# Patient Record
Sex: Male | Born: 1985 | Race: White | Hispanic: No | Marital: Single | Smoking: Never smoker
Health system: Southern US, Community
[De-identification: ages and names within clinical notes are randomized; demographics above are authoritative.]

## PROBLEM LIST (undated history)

## (undated) DIAGNOSIS — N200 Calculus of kidney: Secondary | ICD-10-CM

## (undated) HISTORY — PX: APPENDECTOMY: SHX54

---

## 2007-01-02 ENCOUNTER — Emergency Department: Payer: Self-pay | Admitting: Emergency Medicine

## 2007-08-13 ENCOUNTER — Emergency Department: Payer: Self-pay | Admitting: Emergency Medicine

## 2007-12-17 ENCOUNTER — Emergency Department: Payer: Self-pay | Admitting: Emergency Medicine

## 2012-10-21 ENCOUNTER — Emergency Department: Payer: Self-pay | Admitting: Emergency Medicine

## 2012-10-21 LAB — CBC
HGB: 14.7 g/dL (ref 13.0–18.0)
MCHC: 34.4 g/dL (ref 32.0–36.0)
MCV: 90 fL (ref 80–100)
RBC: 4.72 10*6/uL (ref 4.40–5.90)
RDW: 12.8 % (ref 11.5–14.5)
WBC: 13 10*3/uL — ABNORMAL HIGH (ref 3.8–10.6)

## 2012-10-21 LAB — BASIC METABOLIC PANEL
Anion Gap: 6 — ABNORMAL LOW (ref 7–16)
BUN: 14 mg/dL (ref 7–18)
Chloride: 108 mmol/L — ABNORMAL HIGH (ref 98–107)
Co2: 27 mmol/L (ref 21–32)
Creatinine: 0.87 mg/dL (ref 0.60–1.30)
EGFR (Non-African Amer.): >60
Glucose: 96 mg/dL (ref 65–99)
Osmolality: 282 (ref 275–301)
Sodium: 141 mmol/L (ref 136–145)

## 2012-12-16 ENCOUNTER — Emergency Department: Payer: Self-pay | Admitting: Emergency Medicine

## 2013-01-30 ENCOUNTER — Emergency Department: Payer: Self-pay | Admitting: Emergency Medicine

## 2013-09-08 ENCOUNTER — Emergency Department: Payer: Self-pay | Admitting: Emergency Medicine

## 2013-09-08 LAB — COMPREHENSIVE METABOLIC PANEL
ALBUMIN: 4.4 g/dL (ref 3.4–5.0)
Alkaline Phosphatase: 67 U/L
Anion Gap: 4 — ABNORMAL LOW (ref 7–16)
BUN: 8 mg/dL (ref 7–18)
Bilirubin,Total: 0.2 mg/dL (ref 0.2–1.0)
CALCIUM: 9.1 mg/dL (ref 8.5–10.1)
CHLORIDE: 106 mmol/L (ref 98–107)
Co2: 29 mmol/L (ref 21–32)
Creatinine: 0.95 mg/dL (ref 0.60–1.30)
EGFR (Non-African Amer.): >60
Glucose: 90 mg/dL (ref 65–99)
Osmolality: 275 (ref 275–301)
Potassium: 4 mmol/L (ref 3.5–5.1)
SGOT(AST): 13 U/L — ABNORMAL LOW (ref 15–37)
SGPT (ALT): 25 U/L (ref 12–78)
SODIUM: 139 mmol/L (ref 136–145)
Total Protein: 8.2 g/dL (ref 6.4–8.2)

## 2013-09-08 LAB — URINALYSIS, COMPLETE
Bilirubin,UR: NEGATIVE
Glucose,UR: NEGATIVE mg/dL (ref 0–75)
KETONE: NEGATIVE
Nitrite: NEGATIVE
PH: 6 (ref 4.5–8.0)
Protein: NEGATIVE
RBC,UR: 544 /HPF (ref 0–5)
Specific Gravity: 1.012 (ref 1.003–1.030)
Squamous Epithelial: NONE SEEN

## 2013-09-08 LAB — CBC
HCT: 42.8 % (ref 40.0–52.0)
HGB: 14.5 g/dL (ref 13.0–18.0)
MCH: 30.4 pg (ref 26.0–34.0)
MCHC: 33.9 g/dL (ref 32.0–36.0)
MCV: 90 fL (ref 80–100)
Platelet: 196 10*3/uL (ref 150–440)
RBC: 4.77 10*6/uL (ref 4.40–5.90)
RDW: 12.8 % (ref 11.5–14.5)
WBC: 9.1 10*3/uL (ref 3.8–10.6)

## 2014-01-02 ENCOUNTER — Inpatient Hospital Stay: Payer: Self-pay | Admitting: Internal Medicine

## 2014-01-02 LAB — COMPREHENSIVE METABOLIC PANEL
ALT: 25 U/L
Albumin: 3.8 g/dL (ref 3.4–5.0)
Alkaline Phosphatase: 62 U/L
Anion Gap: 8 (ref 7–16)
BUN: 6 mg/dL — AB (ref 7–18)
Bilirubin,Total: 0.2 mg/dL (ref 0.2–1.0)
Calcium, Total: 8.5 mg/dL (ref 8.5–10.1)
Chloride: 108 mmol/L — ABNORMAL HIGH (ref 98–107)
Co2: 25 mmol/L (ref 21–32)
Creatinine: 0.99 mg/dL (ref 0.60–1.30)
EGFR (African American): >60
EGFR (Non-African Amer.): >60
GLUCOSE: 115 mg/dL — AB (ref 65–99)
OSMOLALITY: 280 (ref 275–301)
Potassium: 3.5 mmol/L (ref 3.5–5.1)
SGOT(AST): 18 U/L (ref 15–37)
SODIUM: 141 mmol/L (ref 136–145)
Total Protein: 7 g/dL (ref 6.4–8.2)

## 2014-01-02 LAB — URINALYSIS, COMPLETE
BILIRUBIN, UR: NEGATIVE
BLOOD: NEGATIVE
Bacteria: NONE SEEN
Glucose,UR: NEGATIVE mg/dL (ref 0–75)
KETONE: NEGATIVE
LEUKOCYTE ESTERASE: NEGATIVE
Nitrite: NEGATIVE
PH: 5 (ref 4.5–8.0)
Protein: NEGATIVE
SQUAMOUS EPITHELIAL: NONE SEEN
Specific Gravity: 1.005 (ref 1.003–1.030)
WBC UR: <1 /HPF (ref 0–5)

## 2014-01-02 LAB — CBC WITH DIFFERENTIAL/PLATELET
BASOS ABS: 0 10*3/uL (ref 0.0–0.1)
Basophil #: 0 10*3/uL (ref 0.0–0.1)
Basophil %: 0.2 %
Basophil %: 0.3 %
EOS ABS: 0.4 10*3/uL (ref 0.0–0.7)
EOS ABS: 0.3 10*3/uL (ref 0.0–0.7)
EOS PCT: 3.6 %
Eosinophil %: 3.5 %
HCT: 40.4 % (ref 40.0–52.0)
HCT: 39.2 % — AB (ref 40.0–52.0)
HGB: 13.4 g/dL (ref 13.0–18.0)
HGB: 13.1 g/dL (ref 13.0–18.0)
LYMPHS ABS: 2.2 10*3/uL (ref 1.0–3.6)
LYMPHS ABS: 2.6 10*3/uL (ref 1.0–3.6)
LYMPHS PCT: 22 %
LYMPHS PCT: 30.4 %
MCH: 30.4 pg (ref 26.0–34.0)
MCH: 30.7 pg (ref 26.0–34.0)
MCHC: 33.2 g/dL (ref 32.0–36.0)
MCHC: 33.4 g/dL (ref 32.0–36.0)
MCV: 92 fL (ref 80–100)
MCV: 92 fL (ref 80–100)
MONOS PCT: 8.7 %
Monocyte #: 0.9 x10 3/mm (ref 0.2–1.0)
Monocyte #: 0.7 x10 3/mm (ref 0.2–1.0)
Monocyte %: 8.6 %
NEUTROS ABS: 6.5 10*3/uL (ref 1.4–6.5)
NEUTROS PCT: 65.6 %
Neutrophil #: 4.9 10*3/uL (ref 1.4–6.5)
Neutrophil %: 57.1 %
Platelet: 222 10*3/uL (ref 150–440)
Platelet: 190 10*3/uL (ref 150–440)
RBC: 4.41 10*6/uL (ref 4.40–5.90)
RBC: 4.27 10*6/uL — ABNORMAL LOW (ref 4.40–5.90)
RDW: 13.1 % (ref 11.5–14.5)
RDW: 13.2 % (ref 11.5–14.5)
WBC: 10 10*3/uL (ref 3.8–10.6)
WBC: 8.6 10*3/uL (ref 3.8–10.6)

## 2014-01-02 LAB — FIBRIN DEGRADATION PROD.(ARMC ONLY): Fibrin Degradation Prod.: <10 ug/ml (ref 2.1–7.7)

## 2014-01-02 LAB — PROTIME-INR
INR: 1
INR: 1
PROTHROMBIN TIME: 13.2 s (ref 11.5–14.7)
Prothrombin Time: 13.1 secs (ref 11.5–14.7)

## 2014-01-02 LAB — D-DIMER(ARMC): D-Dimer: 265 ng/ml

## 2014-01-02 LAB — FIBRINOGEN: Fibrinogen: 317 mg/dL (ref 210–470)

## 2014-01-02 LAB — APTT
Activated PTT: 28.6 secs (ref 23.6–35.9)
Activated PTT: 29 secs (ref 23.6–35.9)

## 2014-02-27 ENCOUNTER — Emergency Department: Payer: Self-pay | Admitting: Emergency Medicine

## 2014-04-04 IMAGING — CT CT STONE STUDY
3 of 4 series · 5 of 16 positions shown, 6 images · non-contrast
Comparison: None.

CLINICAL DATA: 27-year-old male with left flank pain radiating into
the left lower quadrant with associated hematuria.

EXAM:
CT ABDOMEN AND PELVIS WITHOUT CONTRAST
TECHNIQUE: Multidetector CT imaging of the abdomen and pelvis was performed
following the standard protocol without IV contrast.

[Series 4: lung · axial · 0.74mm/px · z∈[-726,-726]mm · 1 of 22 slices shown, 2 images]
[im 1/22  soft-tissue]
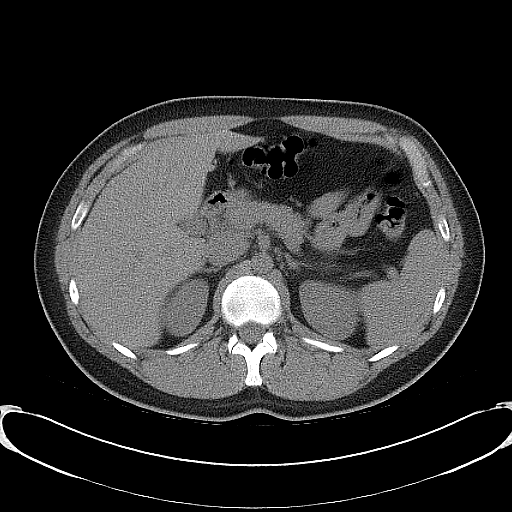
[im 1/22  bone]
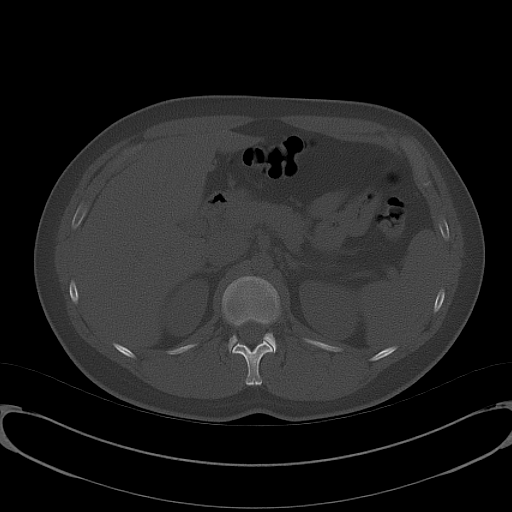

[Series 5: coronal · coronal · 0.69mm/px · 3 of 109 slices shown]
[im 28/109  soft-tissue]
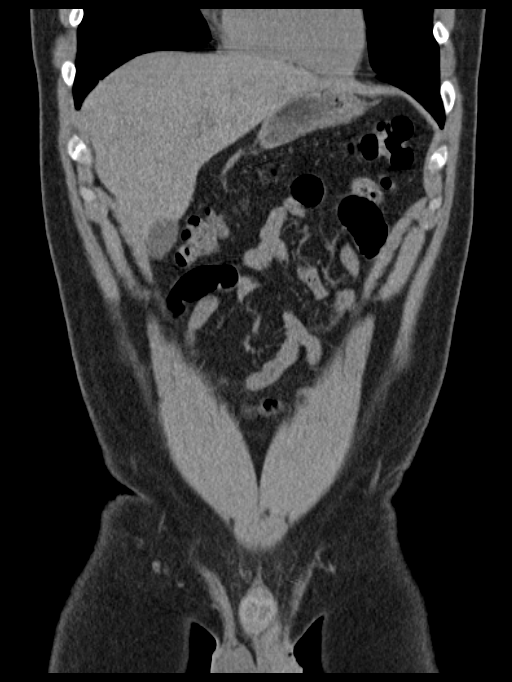
[im 55/109  soft-tissue]
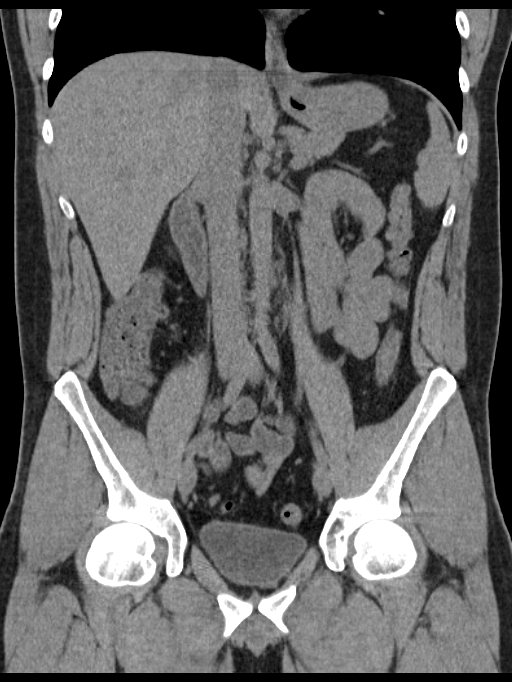
[im 82/109  soft-tissue]
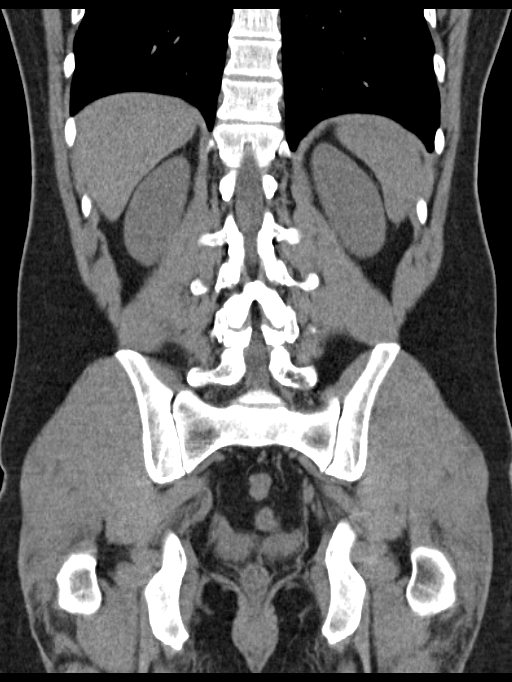

[Series 6: sagittal · sagittal · 0.53mm/px · 1 of 164 slices shown]
[im 82/164  soft-tissue]
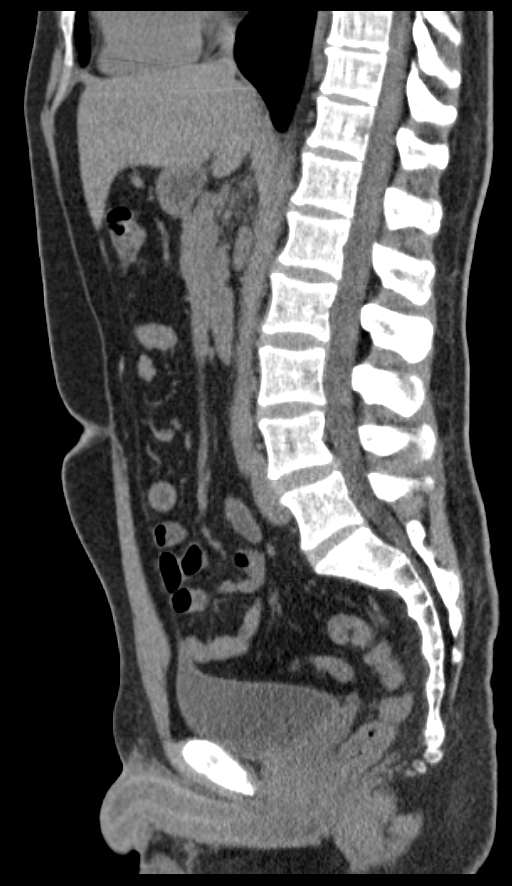

[5 of 16 positions shown; findings below may reference images not displayed]

FINDINGS: Lower Chest: The lung bases are clear. Visualized cardiac structures
are within normal limits for size. No pericardial effusion.
Unremarkable visualized distal thoracic esophagus.

Abdomen: Unenhanced CT was performed per clinician order. Lack of IV
contrast limits sensitivity and specificity, especially for
evaluation of abdominal/pelvic solid viscera. Within these
limitations, unremarkable CT appearance of the stomach, duodenum,
spleen, adrenal glands, pancreas and liver. Gallbladder is
unremarkable. No intra or extrahepatic biliary ductal dilatation.

Unremarkable appearance of the bilateral kidneys. No focal solid
lesion, hydronephrosis or nephrolithiasis.

No evidence of obstruction or focal bowel wall thickening. The
appendix is not identified and may be surgically absent or
completely decompressed. There is no evidence of inflammatory change
in the right lower quadrant. The terminal ileum is unremarkable.

Pelvis: Unremarkable bladder, prostate gland and seminal vesicles.
No free fluid or suspicious adenopathy.

Bones/Soft Tissues: Chronic bilateral L4 pars defects are present.
There is no evidence of anterolisthesis of L4 on L5. No acute
fracture or aggressive appearing lytic or blastic osseous lesion.

Vascular: Limited evaluation in the absence of IV contrast. No
significant atherosclerotic vascular disease, aneurysmal dilatation
or acute abnormality.
IMPRESSION: 1. No acute abnormality on this noncontrast CT scan of the abdomen
and pelvis. Specifically, no evidence of nephro or ureterolithiasis.
2. Chronic bilateral L4 pars defects (spondylolysis) without
evidence of anterolisthesis.

## 2014-10-04 NOTE — Discharge Summary (Signed)
PATIENT NAME:  Adrian Sanders, Adrian Sanders MR#:  [id number] DATE OF BIRTH:  Jun 18, 1985  DATE OF ADMISSION:  01/02/2014 DATE OF DISCHARGE:  01/02/2014  AGAINST MEDICAL ADVICE SUMMARY:   ADMITTING DIAGNOSIS: Snakebite with a copperhead snake, as well as possible cellulitis of the left hand.   HOSPITAL COURSE: Please refer to H and P done by me on admission. The patient is a 29 year old who was bitten on his hand by a copperhead snake.  Came in with swelling of his hands, as well as streaking down his forearm. He received antivenom in the ED and was admitted to the hospital with antibiotics and continuous antivenom. The patient who was in the hospital until later during the hospitalization where he left AMA. He was explained the risk of leaving without complete treatment, which includes possible death. Worsening infection of his hand; however,  patient did not want to stay and left AMA.   TIME SPENT: 35 minutes.     ____________________________ Lacie ScottsShreyang H. Allena KatzPatel, MD shp:TT D: 01/22/2014 10:23:28 ET T: 01/22/2014 11:45:39 ET JOB#: [id number]  cc: Erandi Lemma H. Allena KatzPatel, MD, <Dictator> Charise CarwinSHREYANG H Lilianne Delair MD ELECTRONICALLY SIGNED 01/26/2014 18:41

## 2014-10-04 NOTE — H&P (Signed)
PATIENT NAME:  Adrian Sanders, Adrian Sanders MR#:  [id number] DATE OF BIRTH:  [birth year]  DATE OF ADMISSION:  01/02/2014  PRIMARY CARE PROVIDER: None.  EMERGENCY DEPARTMENT REFERRING PHYSICIAN: Dr. Dorothea GlassmanPaul Malinda.   CHIEF COMPLAINT: Snakebite with a copperhead.   HISTORY OF PRESENT ILLNESS: The patient is a 29 year old, white male, relatively healthy, who reports that he was working on a deck when a snake came from under the deck and bit his finger on the left hand. He states that immediately he started having pain shooting down the finger, and within 15 minutes, he started having erythema on the lateral aspect of his arm and forearm. The patient came to the ED. He actually killed the snake and brought the snake to the Emergency Room. The snake was identified as a copperhead. Poison control was called regarding the snakebite and they recommended a pathway with antivenom with Crotalidae. The patient is in the process of receiving 4 vials, and then they recommended every 6 hours, 2 vials for 18 hours. The patient, besides having significant pain, is denying any fevers or chills. Denies any chest pain, shortness of breath. No nausea, vomiting or diarrhea.   PAST MEDICAL HISTORY: None.   PAST SURGICAL HISTORY: None.   ALLERGIES: PENICILLIN.   MEDICATIONS: None.   SOCIAL HISTORY: Does not smoke. Does not drink. Uses marijuana from time to time.   FAMILY HISTORY: Prostate cancer in his father.   REVIEW OF SYSTEMS:  CONSTITUTIONAL: Denies any fevers, fatigue, or weakness. Significant pain in the left arm. No weight loss. No weight gain.  EYES: No blurred or double vision. No pain. No redness. No inflammation. No glaucoma. No cataracts.  ENT: No tinnitus. No ear pain. No hearing loss. No seasonal or year-round allergies. No epistaxis. No discharge. No difficulty swallowing.  RESPIRATORY: Denies any cough, wheezing, hemoptysis. No COPD.  CARDIOVASCULAR: Denies any chest pain, orthopnea, edema, or arrhythmia.   GASTROINTESTINAL: No nausea, vomiting, diarrhea. No abdominal pain. No hematemesis. No melena. No guarding. No IBS. No jaundice.  GENITOURINARY: Denies any dysuria, hematuria, renal calculus or frequency.  ENDOCRINE: Denies any polyuria, nocturia or thyroid problems. HEMATOLOGIC AND LYMPHATIC: Denies anemia, easy bruisability or bleeding.  SKIN: No acne. Complains of erythema involving the medial aspect of his left arm.  MUSCULOSKELETAL: Denies any pain in the neck, back or shoulder.  NEUROLOGIC: No numbness, CVA, or transient ischemic attack.  PSYCHIATRIC: No anxiety, insomnia or ADD.   PHYSICAL EXAMINATION:  VITAL SIGNS: Temperature 97.8, pulse 85, respirations 20, blood pressure 142/75, O2 of 98%.  GENERAL: The patient is a well-developed, well-nourished male in no acute distress.  HEENT: Head atraumatic, normocephalic. Pupils equally round, reactive to light and accommodation. There is no conjunctival pallor. No scleral icterus. Nasal examination shows no  or ulceration. Oropharynx is clear without any exudate.  NECK: Supple without any JVD.  CARDIOVASCULAR: Regular rate and rhythm. No murmurs, rubs, clicks, or gallops.  LUNGS: Clear to auscultation bilaterally without any rales, rhonchi or wheezing.  ABDOMEN: Soft, nontender, nondistended. Positive bowel sounds x 4.  EXTREMITIES: No clubbing, cyanosis, or edema.  SKIN: He has a snakebite entry on his second ring finger on the left hand. There is some mild swelling in that area. He has erythema on the medial aspect of his left arm and forearm with erythema and some swelling noted.  NEUROLOGIC: Awake, alert, oriented x 3. No focal deficits.  PSYCHIATRIC: Not anxious or depressed.  LYMPH NODES: Nonpalpable. VASCULAR: Good DP and PT pulses.  LABORATORY DATA:  PTT is 28.6. WBC 10, hemoglobin 13.4, platelet count 222,000. Glucose 115, BUN 6, creatinine 0.99, sodium 141, potassium 3.5, chloride 108. LFTs are normal. D-dimer 2.65. INR 1.0,  fibrinogen 317.   ASSESSMENT AND PLAN: The patient is a 29 year old, bitten by a rattlesnake on the finger, now has erythema traveling up to his arm.   1. Copperhead snakebite. The patient will be  admited to hospital  due to the severity of his snakebite. Poison control has been contacted. They recommended antivenom every 6 hours for 3 more doses. We will monitor his coagulation, CBC, and CMP. 2. Erythema of the left hand. We will also treat her with IV antibiotics with cefazolin. Monitor the erythema.  3. Miscellaneous. The patient is ambulatory. He will also have lower extremity compression devices for deep vein thrombosis prophylaxis.   TIME SPENT: 50 minutes.     ____________________________ Lacie Scotts Allena Katz, MD shp:jr D: 01/02/2014 15:34:38 ET T: 01/02/2014 16:39:28 ET JOB#: [id number]  cc: Yekaterina Escutia H. Allena Katz, MD, <Dictator> Charise Carwin MD ELECTRONICALLY SIGNED 01/11/2014 11:05

## 2014-11-01 ENCOUNTER — Emergency Department
Admission: EM | Admit: 2014-11-01 | Discharge: 2014-11-01 | Disposition: A | Discharge status: Home / Self Care | Payer: Medicaid Other | Attending: Emergency Medicine | Admitting: Emergency Medicine

## 2014-11-01 ENCOUNTER — Encounter: Payer: Self-pay | Admitting: *Deleted

## 2014-11-01 ENCOUNTER — Emergency Department: Payer: Medicaid Other

## 2014-11-01 DIAGNOSIS — N200 Calculus of kidney: Secondary | ICD-10-CM | POA: Diagnosis not present

## 2014-11-01 DIAGNOSIS — Z880 Allergy status to penicillin: Secondary | ICD-10-CM | POA: Diagnosis not present

## 2014-11-01 DIAGNOSIS — R300 Dysuria: Secondary | ICD-10-CM | POA: Diagnosis present

## 2014-11-01 HISTORY — DX: Calculus of kidney: N20.0

## 2014-11-01 LAB — URINALYSIS COMPLETE WITH MICROSCOPIC (ARMC ONLY)
BACTERIA UA: NONE SEEN
Bilirubin Urine: NEGATIVE
GLUCOSE, UA: NEGATIVE mg/dL
KETONES UR: NEGATIVE mg/dL
Leukocytes, UA: NEGATIVE
Nitrite: NEGATIVE
PH: 6 (ref 5.0–8.0)
PROTEIN: NEGATIVE mg/dL
SPECIFIC GRAVITY, URINE: 1.014 (ref 1.005–1.030)
SQUAMOUS EPITHELIAL / LPF: NONE SEEN

## 2014-11-01 MED ORDER — TAMSULOSIN HCL 0.4 MG PO CAPS
ORAL_CAPSULE | ORAL | Status: AC
  Filled 2014-11-01: qty 1

## 2014-11-01 MED ORDER — OXYCODONE-ACETAMINOPHEN 5-325 MG PO TABS
ORAL_TABLET | ORAL | Status: AC
  Filled 2014-11-01: qty 2

## 2014-11-01 MED ORDER — OXYCODONE-ACETAMINOPHEN 5-325 MG PO TABS: 1.0000 | ORAL_TABLET | Freq: Four times a day (QID) | ORAL | Status: AC | PRN

## 2014-11-01 MED ORDER — TAMSULOSIN HCL 0.4 MG PO CAPS: 0.4000 mg | ORAL_CAPSULE | Freq: Every morning | ORAL | Status: AC

## 2014-11-01 MED ORDER — TAMSULOSIN HCL 0.4 MG PO CAPS
0.4000 mg | ORAL_CAPSULE | Freq: Once | ORAL | Status: AC
  Administered 2014-11-01: 0.4 mg via ORAL

## 2014-11-01 MED ORDER — OXYCODONE-ACETAMINOPHEN 5-325 MG PO TABS
2.0000 | ORAL_TABLET | Freq: Once | ORAL | Status: AC
  Administered 2014-11-01: 2 via ORAL

## 2014-11-01 NOTE — Discharge Instructions (Signed)
Kidney Stones Kidney stones (urolithiasis) are solid masses that form inside your kidneys. The intense pain is caused by the stone moving through the kidney, ureter, bladder, and urethra (urinary tract). When the stone moves, the ureter starts to spasm around the stone. The stone is usually passed in your pee (urine).  HOME CARE  Drink enough fluids to keep your pee clear or pale yellow. This helps to get the stone out.  Strain all pee through the provided strainer. Do not pee without peeing through the strainer, not even once. If you pee the stone out, catch it in the strainer. The stone may be as small as a grain of salt. Take this to your doctor. This will help your doctor figure out what you can do to try to prevent more kidney stones.  Only take medicine as told by your doctor.  Follow up with your doctor as told.  Get follow-up X-rays as told by your doctor. GET HELP IF: You have pain that gets worse even if you have been taking pain medicine. GET HELP RIGHT AWAY IF:   Your pain does not get better with medicine.  You have a fever or shaking chills.  Your pain increases and gets worse over 18 hours.  You have new belly (abdominal) pain.  You feel faint or pass out.  You are unable to pee. MAKE SURE YOU:   Understand these instructions.  Will watch your condition.  Will get help right away if you are not doing well or get worse. Document Released: 11/16/2007 Document Revised: 01/30/2013 Document Reviewed: 10/31/2012 [facility] Patient Information 2015 [place], [place]. This information is not intended to replace advice given to you by your health care provider. Make sure you discuss any questions you have with your health care provider.  Dysuria Dysuria is the medical term for pain with urination. There are many causes for dysuria, but urinary tract infection is the most common. If a urinalysis was performed it can show that there is a urinary tract infection. A urine culture  confirms that you or your child is sick. You will need to follow up with a healthcare provider because:  If a urine culture was done you will need to know the culture results and treatment recommendations.  If the urine culture was positive, you or your child will need to be put on antibiotics or know if the antibiotics prescribed are the right antibiotics for your urinary tract infection.  If the urine culture is negative (no urinary tract infection), then other causes may need to be explored or antibiotics need to be stopped. Today laboratory work may have been done and there does not seem to be an infection. If cultures were done they will take at least 24 to 48 hours to be completed. Today x-rays may have been taken and they read as normal. No cause can be found for the problems. The x-rays may be re-read by a radiologist and you will be contacted if additional findings are made. You or your child may have been put on medications to help with this problem until you can see your primary caregiver. If the problems get better, see your primary caregiver if the problems return. If you were given antibiotics (medications which kill germs), take all of the mediations as directed for the full course of treatment.  If laboratory work was done, you need to find the results. Leave a telephone number where you can be reached. If this is not possible, make sure you  find out how you are to get test results. HOME CARE INSTRUCTIONS   Drink lots of fluids. For adults, drink eight, 8 ounce glasses of clear juice or water a day. For children, replace fluids as suggested by your caregiver.  Empty the bladder often. Avoid holding urine for long periods of time.  After a bowel movement, women should cleanse front to back, using each tissue only once.  Empty your bladder before and after sexual intercourse.  Take all the medicine given to you until it is gone. You may feel better in a few days, but TAKE ALL  MEDICINE.  Avoid caffeine, tea, alcohol and carbonated beverages, because they tend to irritate the bladder.  In men, alcohol may irritate the prostate.  Only take over-the-counter or prescription medicines for pain, discomfort, or fever as directed by your caregiver.  If your caregiver has given you a follow-up appointment, it is very important to keep that appointment. Not keeping the appointment could result in a chronic or permanent injury, pain, and disability. If there is any problem keeping the appointment, you must call back to this facility for assistance. SEEK IMMEDIATE MEDICAL CARE IF:   Back pain develops.  A fever develops.  There is nausea (feeling sick to your stomach) or vomiting (throwing up).  Problems are no better with medications or are getting worse. MAKE SURE YOU:   Understand these instructions.  Will watch your condition.  Will get help right away if you are not doing well or get worse. Document Released: 02/26/2004 Document Revised: 08/22/2011 Document Reviewed: 01/03/2008 [facility] Patient Information 2015 [place], [place]. This information is not intended to replace advice given to you by your health care provider. Make sure you discuss any questions you have with your health care provider.

## 2014-11-01 NOTE — ED Notes (Signed)
Pt presents w/ c/o hematuria and dysuria. Pt states he passed 2 kidney stones this morning @ 0100. Pt c/o continue pain in urethra and difficulty urinating.

## 2014-11-01 NOTE — ED Provider Notes (Signed)
[facility] Emergency Department Provider Note     Time seen: ----------------------------------------- 6:06 AM on 11/01/2014 -----------------------------------------    I have reviewed the triage vital signs and the nursing notes.   HISTORY  Chief Complaint Dysuria    HPI Adrian Sanders is a 29 y.o. male who presents ER with hematuria and dysuria. He had severe right-sided sharp back pain that woke him up around 100. He had trouble urinating that he was able to pass 2 kidney stones. He could hear them hitting the toilet. He continues to have pain in his urethra like he speaking bleach he states. Denies history of same    Past Medical History  Diagnosis Date  . Kidney stone     There are no active problems to display for this patient.   Past Surgical History  Procedure Laterality Date  . Appendectomy      No current outpatient prescriptions on file.  Allergies Penicillins  History reviewed. No pertinent family history.  Social History History  Substance Use Topics  . Smoking status: Never Smoker   . Smokeless tobacco: Never Used  . Alcohol Use: No    Review of Systems Constitutional: Negative for fever. Eyes: Negative for visual changes. ENT: Negative for sore throat. Cardiovascular: Negative for chest pain. Respiratory: Negative for shortness of breath. Gastrointestinal: Negative for abdominal pain, vomiting and diarrhea. Genitourinary: Positive for dysuria and hematuria Musculoskeletal: Right-sided low back pain Skin: Negative for rash. Neurological: Negative for headaches, focal weakness or numbness.  10-point ROS otherwise negative.  ____________________________________________   PHYSICAL EXAM:  VITAL SIGNS: ED Triage Vitals  Enc Vitals Group     BP 11/01/14 0355 122/71 mmHg     Pulse Rate 11/01/14 0355 80     Resp --      Temp 11/01/14 0355 98.2 F (36.8 C)     Temp Source 11/01/14 0355 Oral     SpO2  11/01/14 0355 100 %     Weight 11/01/14 0355 160 lb (72.576 kg)     Height 11/01/14 0355  (1.676 m)     Head Cir --      Peak Flow --      Pain Score 11/01/14 0411 8     Pain Loc --      Pain Edu? --      Excl. in GC? --     Constitutional: Alert and oriented. Well appearing and in no distress. Eyes: Conjunctivae are normal. PERRL. Normal extraocular movements. ENT   Head: Normocephalic and atraumatic.   Nose: No congestion/rhinnorhea.   Mouth/Throat: Mucous membranes are moist.   Neck: No stridor. Hematological/Lymphatic/Immunilogical: No cervical lymphadenopathy. Cardiovascular: Normal rate, regular rhythm. Normal and symmetric distal pulses are present in all extremities. No murmurs, rubs, or gallops. Respiratory: Normal respiratory effort without tachypnea nor retractions. Breath sounds are clear and equal bilaterally. No wheezes/rales/rhonchi. Gastrointestinal: Soft and nontender. No distention. No abdominal bruits. There is no CVA tenderness. Genitourinary: Penis and scrotum appear normal Musculoskeletal: Nontender with normal range of motion in all extremities. No joint effusions.  No lower extremity tenderness nor edema. Neurologic:  Normal speech and language. No gross focal neurologic deficits are appreciated. Speech is normal. No gait instability. Skin:  Skin is warm, dry and intact. No rash noted. Psychiatric: Mood and affect are normal. Speech and behavior are normal. Patient exhibits appropriate insight and judgment.  ____________________________________________    LABS (pertinent positives/negatives)  Labs Reviewed  URINALYSIS COMPLETEWITH MICROSCOPIC ([facility])  - Abnormal; Notable for  the following:    Color, Urine STRAW (*)    APPearance CLEAR (*)    Hgb urine dipstick 3+ (*)    All other components within normal limits    ____________________________________________  ED COURSE:  Pertinent labs & imaging results that were available during  my care of the patient were reviewed by me and considered in my medical decision making (see chart for details).  Pain control and Flomax given, will get KUB ____________________________________________   RADIOLOGY  KUB FINDINGS: The bowel gas pattern is normal. No radio-opaque calculi or other significant radiographic abnormality are seen.  IMPRESSION: Negative. ____________________________________________    FINAL ASSESSMENT AND PLAN  Renal colic and hematuria  Plan: Patient was dysuria relative to recently passed kidney stone. No kidney stones identified on his KUB. Given a Percocet as well as some Flomax, he'll continue on same and follow-up with his doctor or return here for improving.    Emily FilbertWilliams, Jonathan E, MD   Emily FilbertJonathan E Williams, MD 11/01/14 [id number]

## 2015-02-19 ENCOUNTER — Encounter: Payer: Self-pay | Admitting: Emergency Medicine

## 2015-02-19 DIAGNOSIS — S79912A Unspecified injury of left hip, initial encounter: Secondary | ICD-10-CM | POA: Diagnosis not present

## 2015-02-19 DIAGNOSIS — W1839XA Other fall on same level, initial encounter: Secondary | ICD-10-CM | POA: Diagnosis not present

## 2015-02-19 DIAGNOSIS — Y998 Other external cause status: Secondary | ICD-10-CM | POA: Diagnosis not present

## 2015-02-19 DIAGNOSIS — Z880 Allergy status to penicillin: Secondary | ICD-10-CM | POA: Diagnosis not present

## 2015-02-19 DIAGNOSIS — Y9289 Other specified places as the place of occurrence of the external cause: Secondary | ICD-10-CM | POA: Diagnosis not present

## 2015-02-19 DIAGNOSIS — S99912A Unspecified injury of left ankle, initial encounter: Secondary | ICD-10-CM | POA: Diagnosis not present

## 2015-02-19 DIAGNOSIS — S8992XA Unspecified injury of left lower leg, initial encounter: Secondary | ICD-10-CM | POA: Insufficient documentation

## 2015-02-19 DIAGNOSIS — Y9389 Activity, other specified: Secondary | ICD-10-CM | POA: Insufficient documentation

## 2015-02-19 NOTE — ED Notes (Signed)
Pt presents to ED with left knee pain. Pt states earlier this evening he was working on a 2 story roof and "walked off" of it. Pt states he landed on his feet first and then fell to his back. States he has pain to his left hip, left knee, and left ankle but reports his knee has gotten progressively worse and would like to have it checked. Denies hitting his head or any other injury.

## 2015-02-20 ENCOUNTER — Emergency Department: Payer: Medicaid Other

## 2015-02-20 ENCOUNTER — Emergency Department
Admission: EM | Admit: 2015-02-20 | Discharge: 2015-02-20 | Disposition: A | Discharge status: Home / Self Care | Payer: Medicaid Other | Attending: Emergency Medicine | Admitting: Emergency Medicine

## 2015-02-20 DIAGNOSIS — M25562 Pain in left knee: Secondary | ICD-10-CM

## 2015-02-20 MED ORDER — IBUPROFEN 800 MG PO TABS
800.0000 mg | ORAL_TABLET | Freq: Once | ORAL | Status: AC
  Administered 2015-02-20: 800 mg via ORAL
  Filled 2015-02-20: qty 1

## 2015-02-20 NOTE — Discharge Instructions (Signed)
Please seek medical attention for any high fevers, chest pain, shortness of breath, change in behavior, persistent vomiting, bloody stool or any other new or concerning symptoms. ° °Knee Pain °The knee is the complex joint between your thigh and your lower leg. It is made up of bones, tendons, ligaments, and cartilage. The bones that make up the knee are: °· The femur in the thigh. °· The tibia and fibula in the lower leg. °· The patella or kneecap riding in the groove on the lower femur. °CAUSES  °Knee pain is a common complaint with many causes. A few of these causes are: °· Injury, such as: °¨ A ruptured ligament or tendon injury. °¨ Torn cartilage. °· Medical conditions, such as: °¨ Gout °¨ Arthritis °¨ Infections °· Overuse, over training, or overdoing a physical activity. °Knee pain can be minor or severe. Knee pain can accompany debilitating injury. Minor knee problems often respond well to self-care measures or get well on their own. More serious injuries may need medical intervention or even surgery. °SYMPTOMS °The knee is complex. Symptoms of knee problems can vary widely. Some of the problems are: °· Pain with movement and weight bearing. °· Swelling and tenderness. °· Buckling of the knee. °· Inability to straighten or extend your knee. °· Your knee locks and you cannot straighten it. °· Warmth and redness with pain and fever. °· Deformity or dislocation of the kneecap. °DIAGNOSIS  °Determining what is wrong may be very straight forward such as when there is an injury. It can also be challenging because of the complexity of the knee. Tests to make a diagnosis may include: °· Your caregiver taking a history and doing a physical exam. °· Routine X-rays can be used to rule out other problems. X-rays will not reveal a cartilage tear. Some injuries of the knee can be diagnosed by: °¨ Arthroscopy a surgical technique by which a small video camera is inserted through tiny incisions on the sides of the knee.  This procedure is used to examine and repair internal knee joint problems. Tiny instruments can be used during arthroscopy to repair the torn knee cartilage (meniscus). °¨ Arthrography is a radiology technique. A contrast liquid is directly injected into the knee joint. Internal structures of the knee joint then become visible on X-ray film. °¨ An MRI scan is a non X-ray radiology procedure in which magnetic fields and a computer produce two- or three-dimensional images of the inside of the knee. Cartilage tears are often visible using an MRI scanner. MRI scans have largely replaced arthrography in diagnosing cartilage tears of the knee. °· Blood work. °· Examination of the fluid that helps to lubricate the knee joint (synovial fluid). This is done by taking a sample out using a needle and a syringe. °TREATMENT °The treatment of knee problems depends on the cause. Some of these treatments are: °· Depending on the injury, proper casting, splinting, surgery, or physical therapy care will be needed. °· Give yourself adequate recovery time. Do not overuse your joints. If you begin to get sore during workout routines, back off. Slow down or do fewer repetitions. °· For repetitive activities such as cycling or running, maintain your strength and nutrition. °· Alternate muscle groups. For example, if you are a weight lifter, work the upper body on one day and the lower body the next. °· Either tight or weak muscles do not give the proper support for your knee. Tight or weak muscles do not absorb the stress placed on   the knee joint. Keep the muscles surrounding the knee strong. °· Take care of mechanical problems. °¨ If you have flat feet, orthotics or special shoes may help. See your caregiver if you need help. °¨ Arch supports, sometimes with wedges on the inner or outer aspect of the heel, can help. These can shift pressure away from the side of the knee most bothered by osteoarthritis. °¨ A brace called an "unloader"  brace also may be used to help ease the pressure on the most arthritic side of the knee. °· If your caregiver has prescribed crutches, braces, wraps or ice, use as directed. The acronym for this is PRICE. This means protection, rest, ice, compression, and elevation. °· Nonsteroidal anti-inflammatory drugs (NSAIDs), can help relieve pain. But if taken immediately after an injury, they may actually increase swelling. Take NSAIDs with food in your stomach. Stop them if you develop stomach problems. Do not take these if you have a history of ulcers, stomach pain, or bleeding from the bowel. Do not take without your caregiver's approval if you have problems with fluid retention, heart failure, or kidney problems. °· For ongoing knee problems, physical therapy may be helpful. °· Glucosamine and chondroitin are over-the-counter dietary supplements. Both may help relieve the pain of osteoarthritis in the knee. These medicines are different from the usual anti-inflammatory drugs. Glucosamine may decrease the rate of cartilage destruction. °· Injections of a corticosteroid drug into your knee joint may help reduce the symptoms of an arthritis flare-up. They may provide pain relief that lasts a few months. You may have to wait a few months between injections. The injections do have a small increased risk of infection, water retention, and elevated blood sugar levels. °· Hyaluronic acid injected into damaged joints may ease pain and provide lubrication. These injections may work by reducing inflammation. A series of shots may give relief for as long as 6 months. °· Topical painkillers. Applying certain ointments to your skin may help relieve the pain and stiffness of osteoarthritis. Ask your pharmacist for suggestions. Many over the-counter products are approved for temporary relief of arthritis pain. °· In some countries, doctors often prescribe topical NSAIDs for relief of chronic conditions such as arthritis and tendinitis.  A review of treatment with NSAID creams found that they worked as well as oral medications but without the serious side effects. °PREVENTION °· Maintain a healthy weight. Extra pounds put more strain on your joints. °· Get strong, stay limber. Weak muscles are a common cause of knee injuries. Stretching is important. Include flexibility exercises in your workouts. °· Be smart about exercise. If you have osteoarthritis, chronic knee pain or recurring injuries, you may need to change the way you exercise. This does not mean you have to stop being active. If your knees ache after jogging or playing basketball, consider switching to swimming, water aerobics, or other low-impact activities, at least for a few days a week. Sometimes limiting high-impact activities will provide relief. °· Make sure your shoes fit well. Choose footwear that is right for your sport. °· Protect your knees. Use the proper gear for knee-sensitive activities. Use kneepads when playing volleyball or laying carpet. Buckle your seat belt every time you drive. Most shattered kneecaps occur in car accidents. °· Rest when you are tired. °SEEK MEDICAL CARE IF:  °You have knee pain that is continual and does not seem to be getting better.  °SEEK IMMEDIATE MEDICAL CARE IF:  °Your knee joint feels hot to the touch and   you have a high fever. °MAKE SURE YOU:  °· Understand these instructions. °· Will watch your condition. °· Will get help right away if you are not doing well or get worse. °Document Released: 03/27/2007 Document Revised: 08/22/2011 Document Reviewed: 03/27/2007 °[employer]® Patient Information ©2015 [employer]. This information is not intended to replace advice given to you by your health care provider. Make sure you discuss any questions you have with your health care provider. ° °

## 2015-02-20 NOTE — ED Provider Notes (Signed)
[facility] Emergency Department Provider Note    ____________________________________________  Time seen: 0205  I have reviewed the triage vital signs and the nursing notes.   HISTORY  Chief Complaint Knee Pain and Fall   History limited by: Not Limited   HPI Adrian Sanders is a 29 y.o. male who presents to the emergency department today with primary complaint of left knee pain. The patient states that he accident walked off of a 17 foot roof. He states he fell onto his legs and then fell backwards. He denies hearing any crack in her popping noises. He denies blacking out or significant trauma to his head. States that he was able to walk with a limp afterwards. He states he is having some pain in his left ankle and left hip his primary pain is in his left knee. It is worse with flexion.     Past Medical History  Diagnosis Date  . Kidney stone     There are no active problems to display for this patient.   Past Surgical History  Procedure Laterality Date  . Appendectomy      Current Outpatient Rx  Name  Route  Sig  Dispense  Refill  . oxyCODONE-acetaminophen (ROXICET) 5-325 MG per tablet   Oral   Take 1 tablet by mouth every 6 (six) hours as needed.   20 tablet   0     Allergies Penicillins  History reviewed. No pertinent family history.  Social History Social History  Substance Use Topics  . Smoking status: Never Smoker   . Smokeless tobacco: Never Used  . Alcohol Use: Yes    Review of Systems  Constitutional: Negative for fever. Cardiovascular: Negative for chest pain. Respiratory: Negative for shortness of breath. Gastrointestinal: Negative for abdominal pain, vomiting and diarrhea. Musculoskeletal: Left hip, left knee and left ankle pain Skin: Negative for rash. Neurological: Negative for headaches, focal weakness or numbness.   10-point ROS otherwise  negative.  ____________________________________________   PHYSICAL EXAM:  VITAL SIGNS: ED Triage Vitals  Enc Vitals Group     BP 02/19/15 2128 126/78 mmHg     Pulse Rate 02/19/15 2128 88     Resp 02/19/15 2128 18     Temp 02/19/15 2128 98.7 F (37.1 C)     Temp Source 02/19/15 2128 Oral     SpO2 02/19/15 2128 97 %     Weight 02/19/15 2128 165 lb (74.844 kg)     Height 02/19/15 2128 5\' 6"  (1.676 m)     Head Cir --      Peak Flow --      Pain Score 02/19/15 2129 4   Constitutional: Alert and oriented. Well appearing and in no distress. Eyes: Conjunctivae are normal. PERRL. Normal extraocular movements. ENT   Head: Normocephalic and atraumatic.   Nose: No congestion/rhinnorhea.   Mouth/Throat: Mucous membranes are moist.   Neck: No stridor. Cardiovascular: Normal rate, regular rhythm.   Respiratory: Normal respiratory effort without tachypnea nor retractions.  Musculoskeletal: tender to palpation on the lateral part of the left knee. No obvious deformities to the knee.  Neurologic:  Normal speech and language. No gross focal neurologic deficits are appreciated. Speech is normal.  Skin:  Skin is warm, dry and intact. No rash noted. Psychiatric: Mood and affect are normal. Speech and behavior are normal. Patient exhibits appropriate insight and judgment.  ____________________________________________    LABS (pertinent positives/negatives)  None  ____________________________________________   EKG  None  ____________________________________________  RADIOLOGY  Left knee pain IMPRESSION: Normal appendix. No evidence of bowel obstruction or inflammation. No acute process identified.  ____________________________________________   PROCEDURES  Procedure(s) performed: None  Critical Care performed: No  ____________________________________________   INITIAL IMPRESSION / ASSESSMENT AND PLAN / ED COURSE  Pertinent labs & imaging results that  were available during my care of the patient were reviewed by me and considered in my medical decision making (see chart for details).  Patient presented to the emergency department today complaining of left knee pain after a fall. X-rays without any acute osseous injury. I did walk the patient is able to walk and bear weight on his left leg and only had a mild limp. Will plan on wrapping the. I did discuss rice with the patient.  ____________________________________________   FINAL CLINICAL IMPRESSION(S) / ED DIAGNOSES  Final diagnoses:  Knee pain, acute, left     Phineas Semen, MD 02/20/15 1610

## 2015-03-31 ENCOUNTER — Emergency Department: Payer: Medicaid Other

## 2015-03-31 ENCOUNTER — Other Ambulatory Visit: Payer: Self-pay

## 2015-03-31 ENCOUNTER — Encounter: Payer: Self-pay | Admitting: *Deleted

## 2015-03-31 ENCOUNTER — Emergency Department
Admission: EM | Admit: 2015-03-31 | Discharge: 2015-03-31 | Disposition: A | Discharge status: Home / Self Care | Payer: Medicaid Other | Attending: Emergency Medicine | Admitting: Emergency Medicine

## 2015-03-31 DIAGNOSIS — R0789 Other chest pain: Secondary | ICD-10-CM

## 2015-03-31 DIAGNOSIS — Z880 Allergy status to penicillin: Secondary | ICD-10-CM | POA: Diagnosis not present

## 2015-03-31 DIAGNOSIS — F1110 Opioid abuse, uncomplicated: Secondary | ICD-10-CM | POA: Diagnosis not present

## 2015-03-31 DIAGNOSIS — F1210 Cannabis abuse, uncomplicated: Secondary | ICD-10-CM | POA: Diagnosis not present

## 2015-03-31 DIAGNOSIS — Z79899 Other long term (current) drug therapy: Secondary | ICD-10-CM | POA: Diagnosis not present

## 2015-03-31 DIAGNOSIS — F1310 Sedative, hypnotic or anxiolytic abuse, uncomplicated: Secondary | ICD-10-CM | POA: Diagnosis not present

## 2015-03-31 DIAGNOSIS — R569 Unspecified convulsions: Secondary | ICD-10-CM | POA: Diagnosis not present

## 2015-03-31 DIAGNOSIS — R079 Chest pain, unspecified: Secondary | ICD-10-CM | POA: Diagnosis present

## 2015-03-31 LAB — CBC WITH DIFFERENTIAL/PLATELET
Basophils Absolute: 0.1 10*3/uL (ref 0–0.1)
Basophils Relative: 0 %
EOS ABS: 0 10*3/uL (ref 0–0.7)
Eosinophils Relative: 0 %
HCT: 47.2 % (ref 40.0–52.0)
HEMOGLOBIN: 16.2 g/dL (ref 13.0–18.0)
LYMPHS PCT: 8 %
Lymphs Abs: 1.8 10*3/uL (ref 1.0–3.6)
MCH: 29.7 pg (ref 26.0–34.0)
MCHC: 34.2 g/dL (ref 32.0–36.0)
MCV: 86.9 fL (ref 80.0–100.0)
MONOS PCT: 7 %
Monocytes Absolute: 1.5 10*3/uL — ABNORMAL HIGH (ref 0.2–1.0)
Neutro Abs: 20.1 10*3/uL — ABNORMAL HIGH (ref 1.4–6.5)
Neutrophils Relative %: 85 %
Platelets: 267 10*3/uL (ref 150–440)
RBC: 5.44 MIL/uL (ref 4.40–5.90)
RDW: 13 % (ref 11.5–14.5)
WBC: 23.5 10*3/uL — ABNORMAL HIGH (ref 3.8–10.6)

## 2015-03-31 LAB — COMPREHENSIVE METABOLIC PANEL
ALBUMIN: 4.8 g/dL (ref 3.5–5.0)
ALK PHOS: 63 U/L (ref 38–126)
ALT: 45 U/L (ref 17–63)
AST: 26 U/L (ref 15–41)
Anion gap: 11 (ref 5–15)
BILIRUBIN TOTAL: 0.4 mg/dL (ref 0.3–1.2)
BUN: 7 mg/dL (ref 6–20)
CALCIUM: 9.8 mg/dL (ref 8.9–10.3)
CO2: 22 mmol/L (ref 22–32)
Chloride: 104 mmol/L (ref 101–111)
Creatinine, Ser: 0.77 mg/dL (ref 0.61–1.24)
GFR calc Af Amer: >60 mL/min (ref >60)
GFR calc non Af Amer: >60 mL/min (ref >60)
GLUCOSE: 123 mg/dL — AB (ref 65–99)
Potassium: 3.4 mmol/L — ABNORMAL LOW (ref 3.5–5.1)
SODIUM: 137 mmol/L (ref 135–145)
TOTAL PROTEIN: 8.3 g/dL — AB (ref 6.5–8.1)

## 2015-03-31 LAB — URINALYSIS COMPLETE WITH MICROSCOPIC (ARMC ONLY)
BILIRUBIN URINE: NEGATIVE
Bacteria, UA: NONE SEEN
Glucose, UA: NEGATIVE mg/dL
HGB URINE DIPSTICK: NEGATIVE
KETONES UR: NEGATIVE mg/dL
Leukocytes, UA: NEGATIVE
Nitrite: NEGATIVE
PH: 5 (ref 5.0–8.0)
Protein, ur: 30 mg/dL — AB
SPECIFIC GRAVITY, URINE: 1.017 (ref 1.005–1.030)
Squamous Epithelial / LPF: NONE SEEN

## 2015-03-31 LAB — TROPONIN I: Troponin I: <0.03 ng/mL (ref <0.031)

## 2015-03-31 LAB — URINE DRUG SCREEN, QUALITATIVE (ARMC ONLY)
AMPHETAMINES, UR SCREEN: NOT DETECTED
BARBITURATES, UR SCREEN: NOT DETECTED
BENZODIAZEPINE, UR SCRN: POSITIVE — AB
CANNABINOID 50 NG, UR ~~LOC~~: POSITIVE — AB
Cocaine Metabolite,Ur ~~LOC~~: NOT DETECTED
MDMA (Ecstasy)Ur Screen: NOT DETECTED
Methadone Scn, Ur: NOT DETECTED
OPIATE, UR SCREEN: POSITIVE — AB
PHENCYCLIDINE (PCP) UR S: NOT DETECTED
Tricyclic, Ur Screen: NOT DETECTED

## 2015-03-31 LAB — ETHANOL: Alcohol, Ethyl (B): <5 mg/dL (ref <5)

## 2015-03-31 MED ORDER — HYDROMORPHONE HCL 1 MG/ML IJ SOLN
INTRAMUSCULAR | Status: AC
  Administered 2015-03-31: 0.5 mg via INTRAVENOUS
  Filled 2015-03-31: qty 1

## 2015-03-31 MED ORDER — HYDROMORPHONE HCL 1 MG/ML IJ SOLN
0.5000 mg | Freq: Once | INTRAMUSCULAR | Status: AC
Start: 2015-03-31 — End: 2015-03-31
  Administered 2015-03-31: 0.5 mg via INTRAVENOUS

## 2015-03-31 MED ORDER — KETOROLAC TROMETHAMINE 30 MG/ML IJ SOLN
30.0000 mg | Freq: Once | INTRAMUSCULAR | Status: AC
  Administered 2015-03-31: 30 mg via INTRAVENOUS
  Filled 2015-03-31: qty 1

## 2015-03-31 NOTE — Discharge Instructions (Signed)

## 2015-03-31 NOTE — ED Notes (Signed)
Pt sent here by EMS, possible seizure like activity 1-2 hr ago, pt denies loss of bowel or bladder, A&O x4  At this time, now c/o chest pressure.

## 2015-03-31 NOTE — ED Notes (Signed)
AAOx3.  Skin warm and dry.  Respirations regular and easy.  States pain level much improved.  Now pain is "4/10" with movement.

## 2015-03-31 NOTE — ED Provider Notes (Addendum)
[facility] Emergency Department Provider Note  ____________________________________________  Time seen: Approximately 7:22 PM  I have reviewed the triage vital signs and the nursing notes.   HISTORY  Chief Complaint Seizures and Chest Pain    HPI Adrian Sanders is a 29 y.o. male patient reports he passed out doesn't know what happened his fiance told him he fell onto the bed and was jerking all over for approximately 10 minutes. He was somewhat confused afterwards. Patient now is awake alert oriented 3 but complains of pain in his chest is worse with movement palpation or deep breathing. He is not sweaty not short of breath the pain does not radiate anywhere his ability take deep breath however is limited by pain the pain is moderate in nature achy there are no other associated symptoms.   Past Medical History  Diagnosis Date  . Kidney stone     There are no active problems to display for this patient.   Past Surgical History  Procedure Laterality Date  . Appendectomy      Current Outpatient Rx  Name  Route  Sig  Dispense  Refill  . Aspirin-Salicylamide-Caffeine (BC HEADACHE PO)   Oral   Take 1 packet by mouth as needed (for headache).         . oxyCODONE-acetaminophen (ROXICET) 5-325 MG per tablet   Oral   Take 1 tablet by mouth every 6 (six) hours as needed. Patient not taking: Reported on 03/31/2015   20 tablet   0     Allergies Penicillins  History reviewed. No pertinent family history.  Social History Social History  Substance Use Topics  . Smoking status: Never Smoker   . Smokeless tobacco: Never Used  . Alcohol Use: Yes    Review of Systems Constitutional: No fever/chills Eyes: No visual changes. ENT: No sore throat. Cardiovascular: See history of present illness Respiratory: Denies shortness of breath. Gastrointestinal: No abdominal pain.  No nausea, no vomiting.  No diarrhea.  No constipation. Genitourinary:  Negative for dysuria. Musculoskeletal: Negative for back pain. Skin: Negative for rash. Neurological: Negative for headaches, focal weakness or numbness.  10-point ROS otherwise negative.  ____________________________________________   PHYSICAL EXAM:  VITAL SIGNS: ED Triage Vitals  Enc Vitals Group     BP 03/31/15 1842 134/90 mmHg     Pulse Rate 03/31/15 1842 60     Resp 03/31/15 1842 16     Temp 03/31/15 1854 98.7 F (37.1 C)     Temp Source 03/31/15 1854 Oral     SpO2 03/31/15 1842 100 %     Weight 03/31/15 1842 175 lb (79.379 kg)     Height 03/31/15 1842  (1.702 m)     Head Cir --      Peak Flow --      Pain Score 03/31/15 1842 8     Pain Loc --      Pain Edu? --      Excl. in GC? --     Constitutional: Alert and oriented. Well appearing and in no acute distress. Eyes: Conjunctivae are normal. PERRL. EOMI. funduscopic exam appears normal. Head: Atraumatic. Nose: No congestion/rhinnorhea. Mouth/Throat: Mucous membranes are moist.  Oropharynx non-erythematous. Neck: No stridor.  No cervical spine tenderness to palpation. Cardiovascular: Normal rate, regular rhythm. Grossly normal heart sounds.  Good peripheral circulation. Respiratory: Normal respiratory effort.  No retractions. Lungs CTAB. Chest is tender to palpation in the mid chest. This outpatient exactly reproduces his pain. Gastrointestinal: Soft and  nontender. No distention. No abdominal bruits. No CVA tenderness. Musculoskeletal: No lower extremity tenderness nor edema.  No joint effusions. Neurologic:  Normal speech and language. No gross focal neurologic deficits are appreciated. Cranial nerves II through XII are intact. Cerebellar finger to nose and rapid alternating movements and hands is normal motor strength is 5 over 5 throughout sensation is intact throughout No gait instability. Skin:  Skin is warm, dry and intact. No rash noted. Psychiatric: Mood and affect are normal. Speech and behavior are  normal.  ____________________________________________   LABS (all labs ordered are listed, but only abnormal results are displayed)  Labs Reviewed  COMPREHENSIVE METABOLIC PANEL - Abnormal; Notable for the following:    Potassium 3.4 (*)    Glucose, Bld 123 (*)    Total Protein 8.3 (*)    All other components within normal limits  CBC WITH DIFFERENTIAL/PLATELET - Abnormal; Notable for the following:    WBC 23.5 (*)    Neutro Abs 20.1 (*)    Monocytes Absolute 1.5 (*)    All other components within normal limits  URINALYSIS COMPLETEWITH MICROSCOPIC ([facility] ONLY) - Abnormal; Notable for the following:    Color, Urine YELLOW (*)    APPearance CLOUDY (*)    Protein, ur 30 (*)    All other components within normal limits  ETHANOL  TROPONIN I  URINE DRUG SCREEN, QUALITATIVE ([facility] ONLY)   ____________________________________________  EKG EKG read and interpreted by me shows normal sinus rhythm rate of 91 normal axis essentially normal EKG ____________________________________________  RADIOLOGY  Chest x-ray read by radiology as no acute disease CT of the head read by radiology as no acute disease ____________________________________________   PROCEDURES    ____________________________________________   INITIAL IMPRESSION / ASSESSMENT AND PLAN / ED COURSE  Pertinent labs & imaging results that were available during my care of the patient were reviewed by me and considered in my medical decision making (see chart for details).   ____________________________________________   FINAL CLINICAL IMPRESSION(S) / ED DIAGNOSES  Final diagnoses:  Seizure (HCC)  Chest wall pain      Arnaldo NatalPaul F Malinda, MD 03/31/15 2056  Patient ready for discharge his dad said his pain had gotten worse when he sat up and moved and he had gotten sweaty at one point so I repeated his EKG. The EKG read and interpreted by me shows normal sinus rhythm at a rate of 70 normal axis essentially  normal EKG  Arnaldo NatalPaul F Malinda, MD 03/31/15 2120

## 2015-03-31 NOTE — ED Notes (Signed)
AAOx3.  Skin warm and dry.  NAD.  Moving all extremities equally and strong.  Ambulates with easy and steady gait.  NAD>

## 2015-03-31 NOTE — ED Notes (Signed)
Patient transported to CT 

## 2015-09-16 IMAGING — CR DG KNEE COMPLETE 4+V*L*
1 series · 5 of 5 positions shown · non-contrast
Comparison: None.

CLINICAL DATA: Left knee pain after fall from roof.

EXAM:
LEFT KNEE - COMPLETE 4+ VIEW

[Series 1: t knee ap left · 0.14mm/px · 5 of 5 slices shown]
[im 1/5]
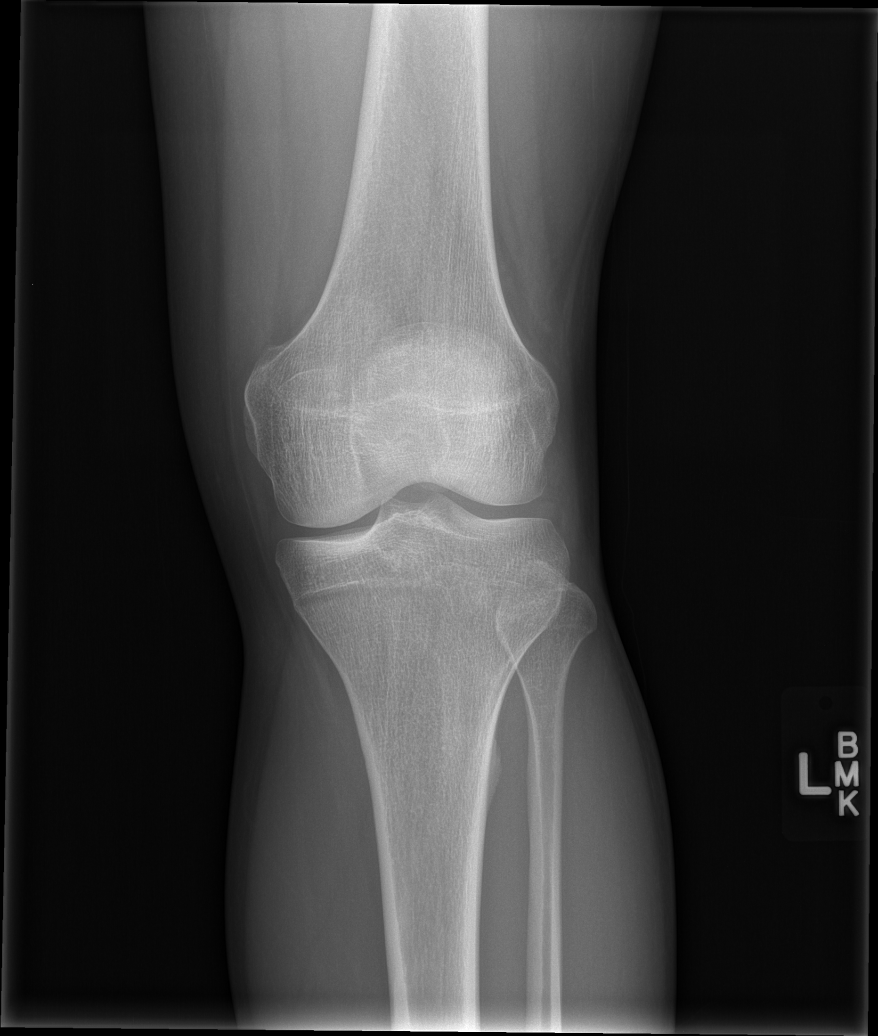
[im 2/5]
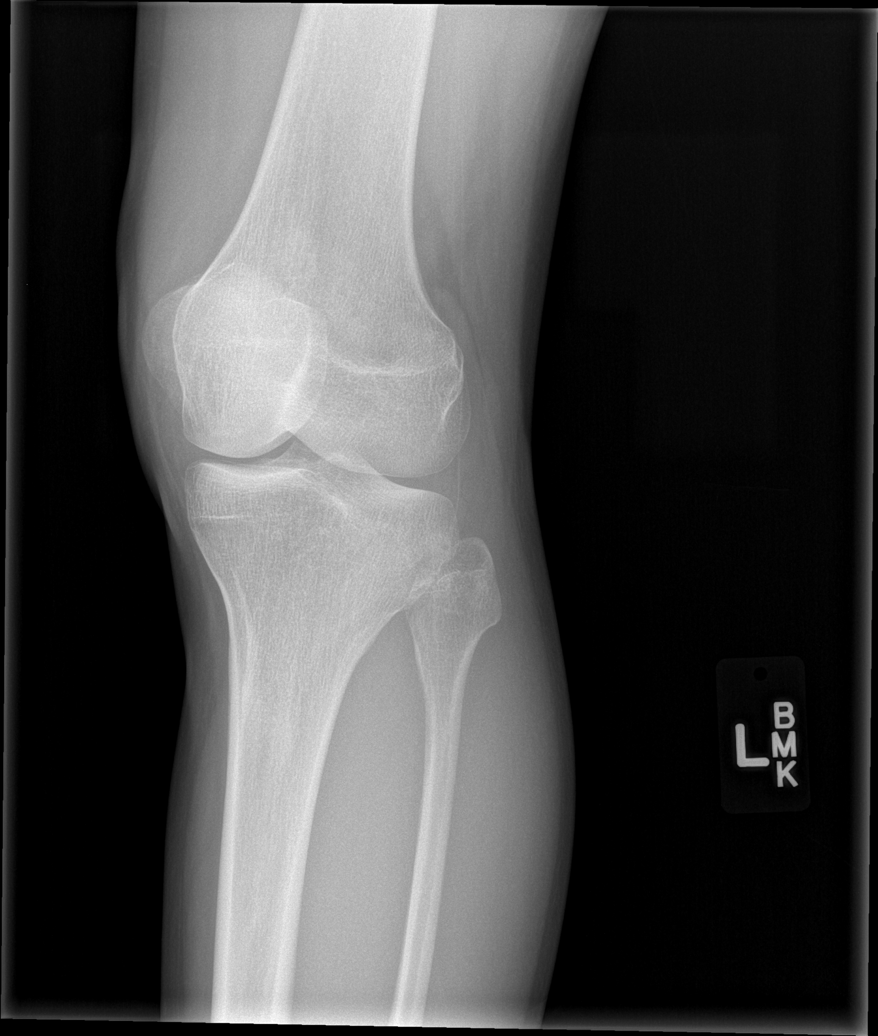
[im 3/5]
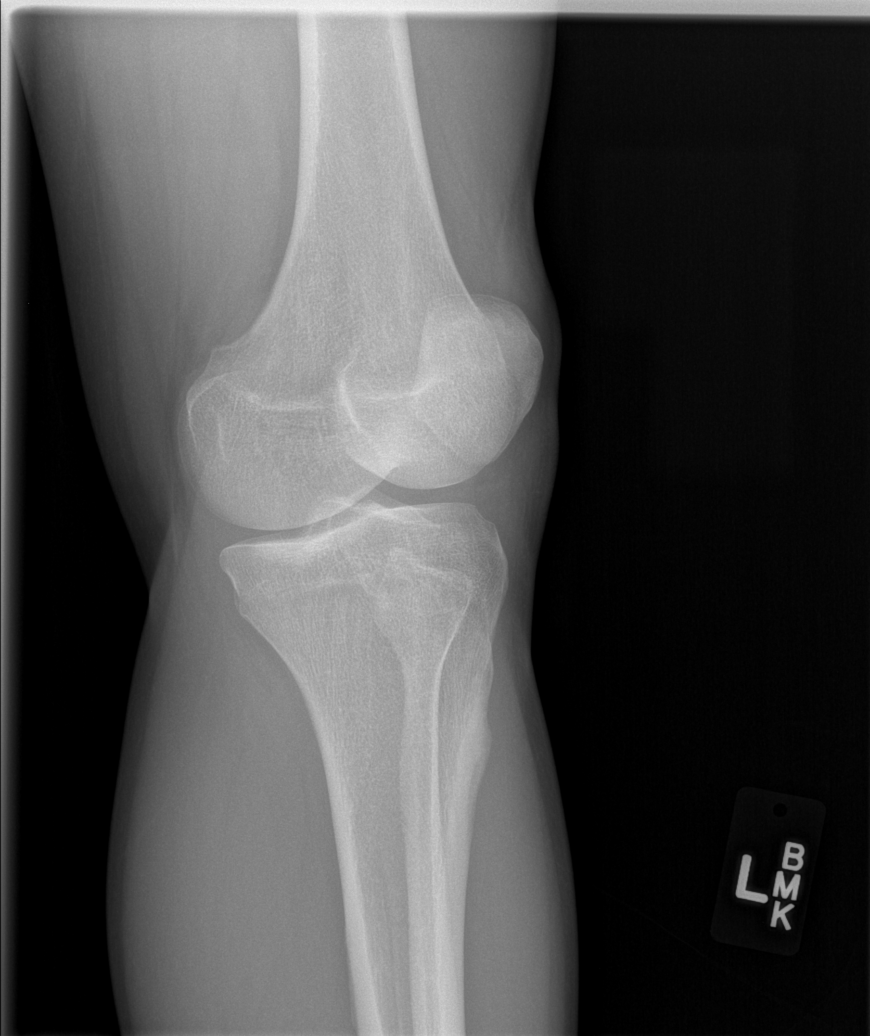
[im 4/5]
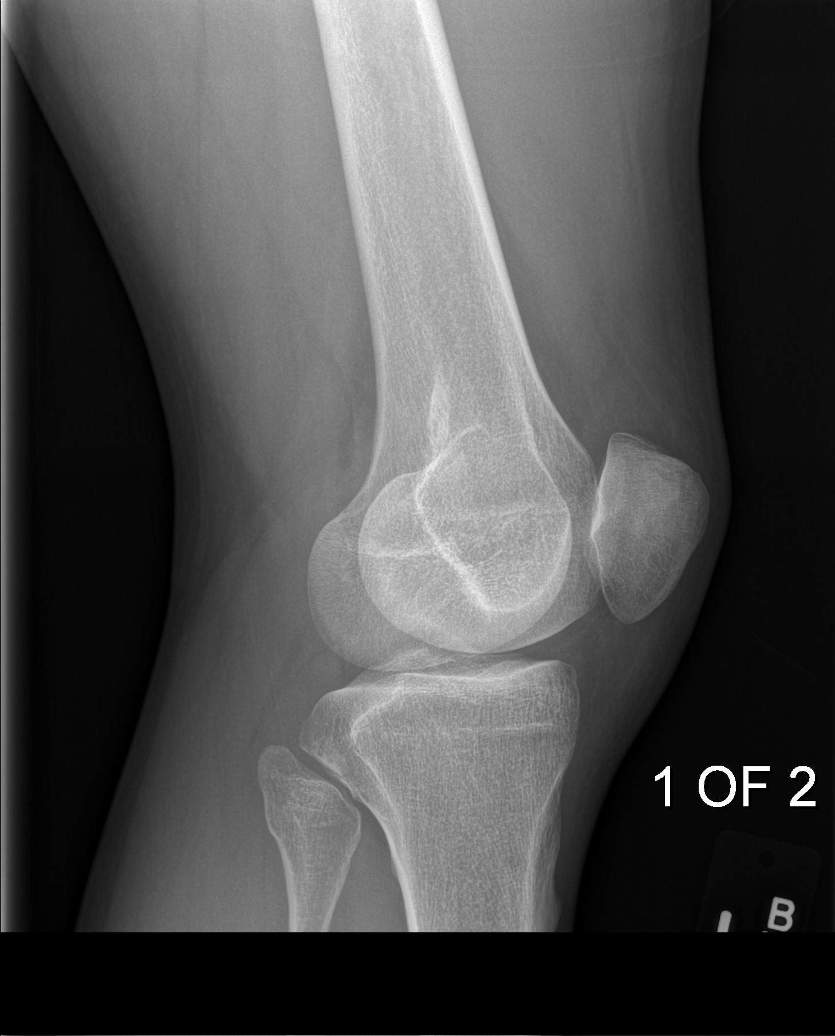
[im 5/5]
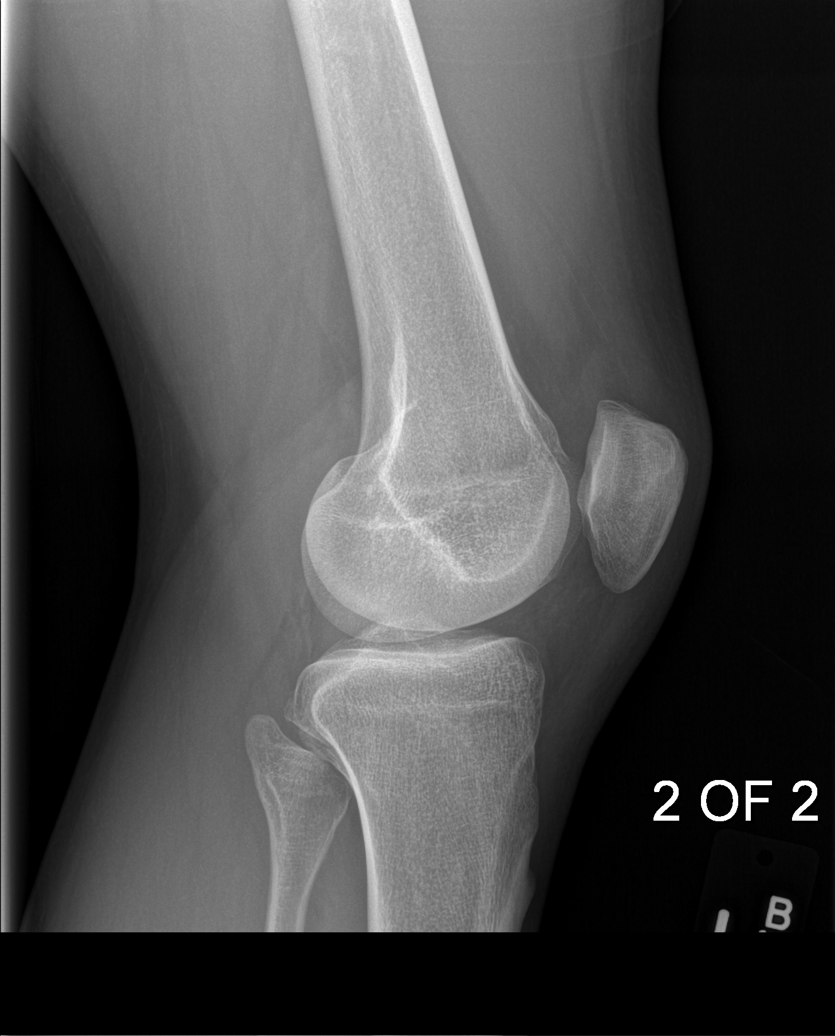

[5 of 5 positions shown; findings below may reference images not displayed]

FINDINGS: There is no evidence of fracture, dislocation, or joint effusion.
There is no evidence of arthropathy or other focal bone abnormality.
Soft tissues are unremarkable.
IMPRESSION: Negative.

## 2015-11-14 ENCOUNTER — Emergency Department
Admission: EM | Admit: 2015-11-14 | Discharge: 2015-11-14 | Disposition: A | Discharge status: Home / Self Care | Payer: Medicaid Other | Attending: Emergency Medicine | Admitting: Emergency Medicine

## 2015-11-14 DIAGNOSIS — R21 Rash and other nonspecific skin eruption: Secondary | ICD-10-CM | POA: Diagnosis not present

## 2015-11-14 MED ORDER — SULFAMETHOXAZOLE-TRIMETHOPRIM 800-160 MG PO TABS: 1.0000 | ORAL_TABLET | Freq: Two times a day (BID) | ORAL | Status: AC

## 2015-11-14 MED ORDER — SULFAMETHOXAZOLE-TRIMETHOPRIM 800-160 MG PO TABS
1.0000 | ORAL_TABLET | Freq: Once | ORAL | Status: AC
  Administered 2015-11-14: 1 via ORAL
  Filled 2015-11-14: qty 1

## 2015-11-14 NOTE — ED Notes (Signed)
Reviewed d/c instructions, follow-up care, and prescription with pt. Pt verbalized understanding 

## 2015-11-14 NOTE — Discharge Instructions (Signed)
It is unclear what has triggered this rash. I would recommend taking antibiotics to cover for infection. Also continue Benadryl for possible allergy process. Return to the emergency room for any worsening symptoms

## 2015-11-14 NOTE — ED Notes (Signed)
Pt c/o of rash to back and shoulders beginning at 5 am this morning. Pt reports rash has spread throughout the day, and itching and pain have been increasing throughout the day. Pt reports he took 1 benadryl at approx 7 am, and another at 10 am with no relief.

## 2015-11-14 NOTE — ED Notes (Signed)
Pt reports rash to his neck and shoulders that started today. Pt has several bumps on neck and shoulders that look like beginning abscess. Pt states he donated plasma today and was concerned "I was on a dirty bed".

## 2015-11-14 NOTE — ED Provider Notes (Signed)
[facility] Emergency Department Provider Note  ____________________________________________  Time seen: Approximately 8:30 PM  I have reviewed the triage vital signs and the nursing notes.   HISTORY  Chief Complaint Rash    HPI Adrian Sanders is a 30 y.o. male who presents with erythematous nodules to his posterior neck since this morning. Has taken Benadryl. Unclear trigger. Some pain and itching involved. Has a similar nodule on his abdomen and right shoulder. No fevers and chills. No known tick bite or bug bites.   Past Medical History  Diagnosis Date  . Kidney stone     There are no active problems to display for this patient.   Past Surgical History  Procedure Laterality Date  . Appendectomy      Current Outpatient Rx  Name  Route  Sig  Dispense  Refill  . Aspirin-Salicylamide-Caffeine (BC HEADACHE PO)   Oral   Take 1 packet by mouth as needed (for headache).         . oxyCODONE-acetaminophen (ROXICET) 5-325 MG per tablet   Oral   Take 1 tablet by mouth every 6 (six) hours as needed. Patient not taking: Reported on 03/31/2015   20 tablet   0   . sulfamethoxazole-trimethoprim (BACTRIM DS,SEPTRA DS) 800-160 MG tablet   Oral   Take 1 tablet by mouth 2 (two) times daily.   14 tablet   0     Allergies Penicillins  No family history on file.  Social History Social History  Substance Use Topics  . Smoking status: Never Smoker   . Smokeless tobacco: Never Used  . Alcohol Use: Yes    Review of Systems Constitutional: No fever/chills Eyes: No visual changes. ENT: No sore throat. Cardiovascular: Denies chest pain. Respiratory: Denies shortness of breath. Gastrointestinal: No abdominal pain.  No nausea, no vomiting.  No diarrhea.  No constipation. Genitourinary: Negative for dysuria. Musculoskeletal: Negative for back pain. Skin: Negative for rash. Neurological: Negative for headaches, focal weakness or  numbness. 10-point ROS otherwise negative.  ____________________________________________   PHYSICAL EXAM:  VITAL SIGNS: ED Triage Vitals  Enc Vitals Group     BP 11/14/15 1915 122/68 mmHg     Pulse Rate 11/14/15 1915 105     Resp 11/14/15 1915 18     Temp 11/14/15 1915 99 F (37.2 C)     Temp Source 11/14/15 1915 Oral     SpO2 11/14/15 1915 95 %     Weight 11/14/15 1915 170 lb (77.111 kg)     Height 11/14/15 1915  (1.676 m)     Head Cir --      Peak Flow --      Pain Score 11/14/15 1916 8     Pain Loc --      Pain Edu? --      Excl. in GC? --     Constitutional: Alert and oriented. Well appearing and in no acute distress. Eyes: Conjunctivae are normal. PERRL. EOMI. Ears:  Clear with normal landmarks. No erythema. Head: Atraumatic. Nose: No congestion/rhinnorhea. Mouth/Throat: Mucous membranes are moist.  Oropharynx non-erythematous. No lesions. Neck:  Supple.  No adenopathy.   Cardiovascular: Normal rate, regular rhythm. Grossly normal heart sounds.  Good peripheral circulation. Respiratory: Normal respiratory effort.  No retractions. Lungs CTAB. Gastrointestinal: Soft and nontender. No distention. No abdominal bruits. No CVA tenderness. Musculoskeletal: Nml ROM of upper and lower extremity joints. Neurologic:  Normal speech and language. No gross focal neurologic deficits are appreciated. No gait instability. Skin:  Mildly tender 1 cm erythematous nodules, approximately 10 scattered over the posterior neck. Psychiatric: Mood and affect are normal. Speech and behavior are normal.  ____________________________________________   LABS (all labs ordered are listed, but only abnormal results are displayed)  Labs Reviewed - No data to display ____________________________________________  EKG    ____________________________________________  RADIOLOGY    ____________________________________________   PROCEDURES  Procedure(s) performed: None  Critical  Care performed: No  ____________________________________________   INITIAL IMPRESSION / ASSESSMENT AND PLAN / ED COURSE  Pertinent labs & imaging results that were available during my care of the patient were reviewed by me and considered in my medical decision making (see chart for details).  30 year old with acute onset of rash, erythematous nodules to his posterior neck. Suspect insect bite though unclear. Encouraged Benadryl over-the-counter. We'll cover for possible early cellulitis as well with Bactrim. Encouraged close follow-up if not improving. ____________________________________________   FINAL CLINICAL IMPRESSION(S) / ED DIAGNOSES  Final diagnoses:  Rash      Ignacia BayleyRobert Shalin Vonbargen, PA-C 11/14/15 2033  Jeanmarie PlantJames A McShane, MD 11/14/15 [phone]
